# Patient Record
Sex: Female | Born: 1990 | Race: White | Hispanic: No | Marital: Single
Health system: Southern US, Community
[De-identification: ages and names within clinical notes are randomized; demographics above are authoritative.]

---

## 2013-10-13 ENCOUNTER — Other Ambulatory Visit: Payer: Self-pay | Admitting: Allergy and Immunology

## 2013-10-13 ENCOUNTER — Other Ambulatory Visit: Payer: Self-pay | Admitting: *Deleted

## 2013-10-13 ENCOUNTER — Ambulatory Visit
Admission: RE | Admit: 2013-10-13 | Discharge: 2013-10-13 | Disposition: A | Discharge status: Home / Self Care | Payer: BC Managed Care – PPO | Source: Ambulatory Visit | Attending: Pediatrics | Admitting: Pediatrics

## 2013-10-13 DIAGNOSIS — R0789 Other chest pain: Secondary | ICD-10-CM

## 2013-10-13 DIAGNOSIS — J45901 Unspecified asthma with (acute) exacerbation: Secondary | ICD-10-CM

## 2014-11-29 IMAGING — CR DG CHEST 2V
2 series · 2 of 2 positions shown · non-contrast
Comparison: None.

CLINICAL DATA: Asthma.  Chest pain.

EXAM:
CHEST  2 VIEW

[w chest pa]
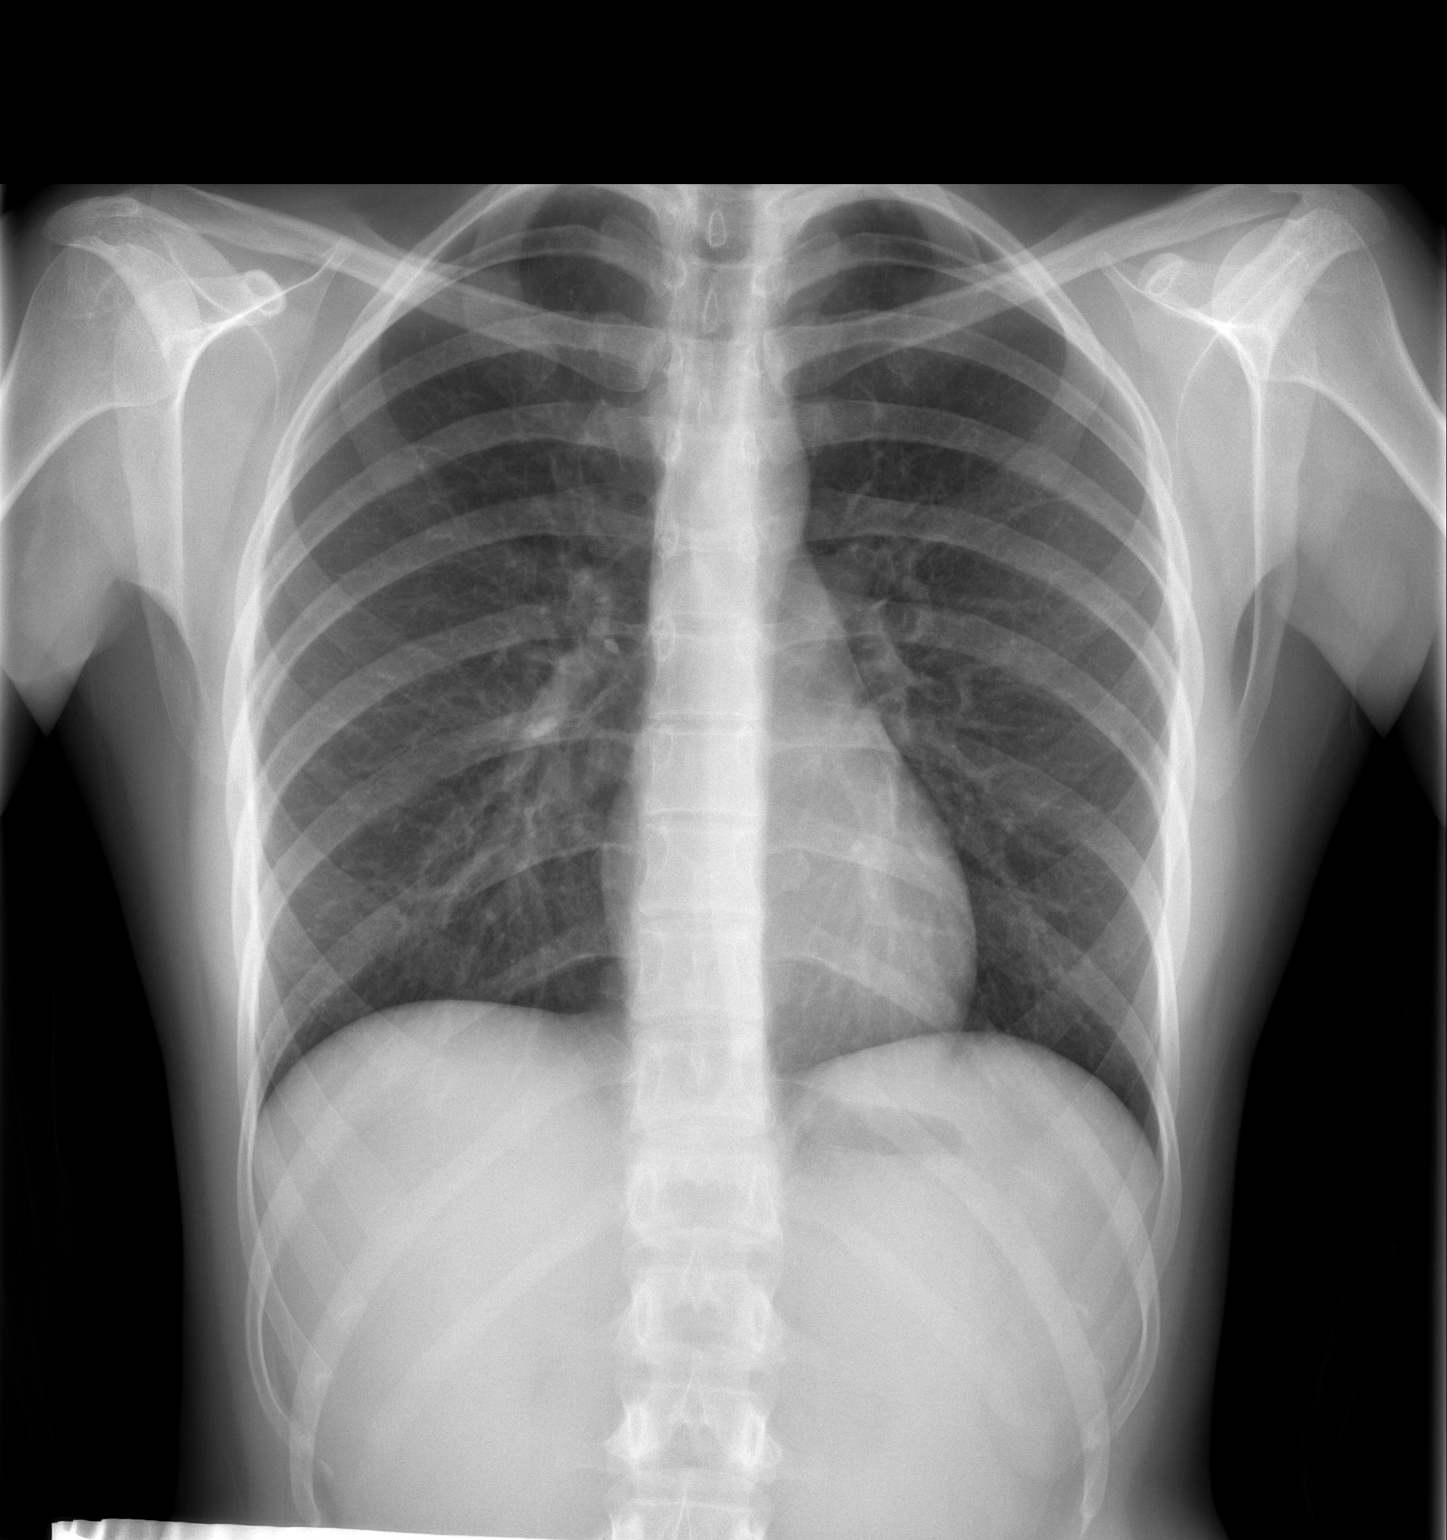

[w chest lat]
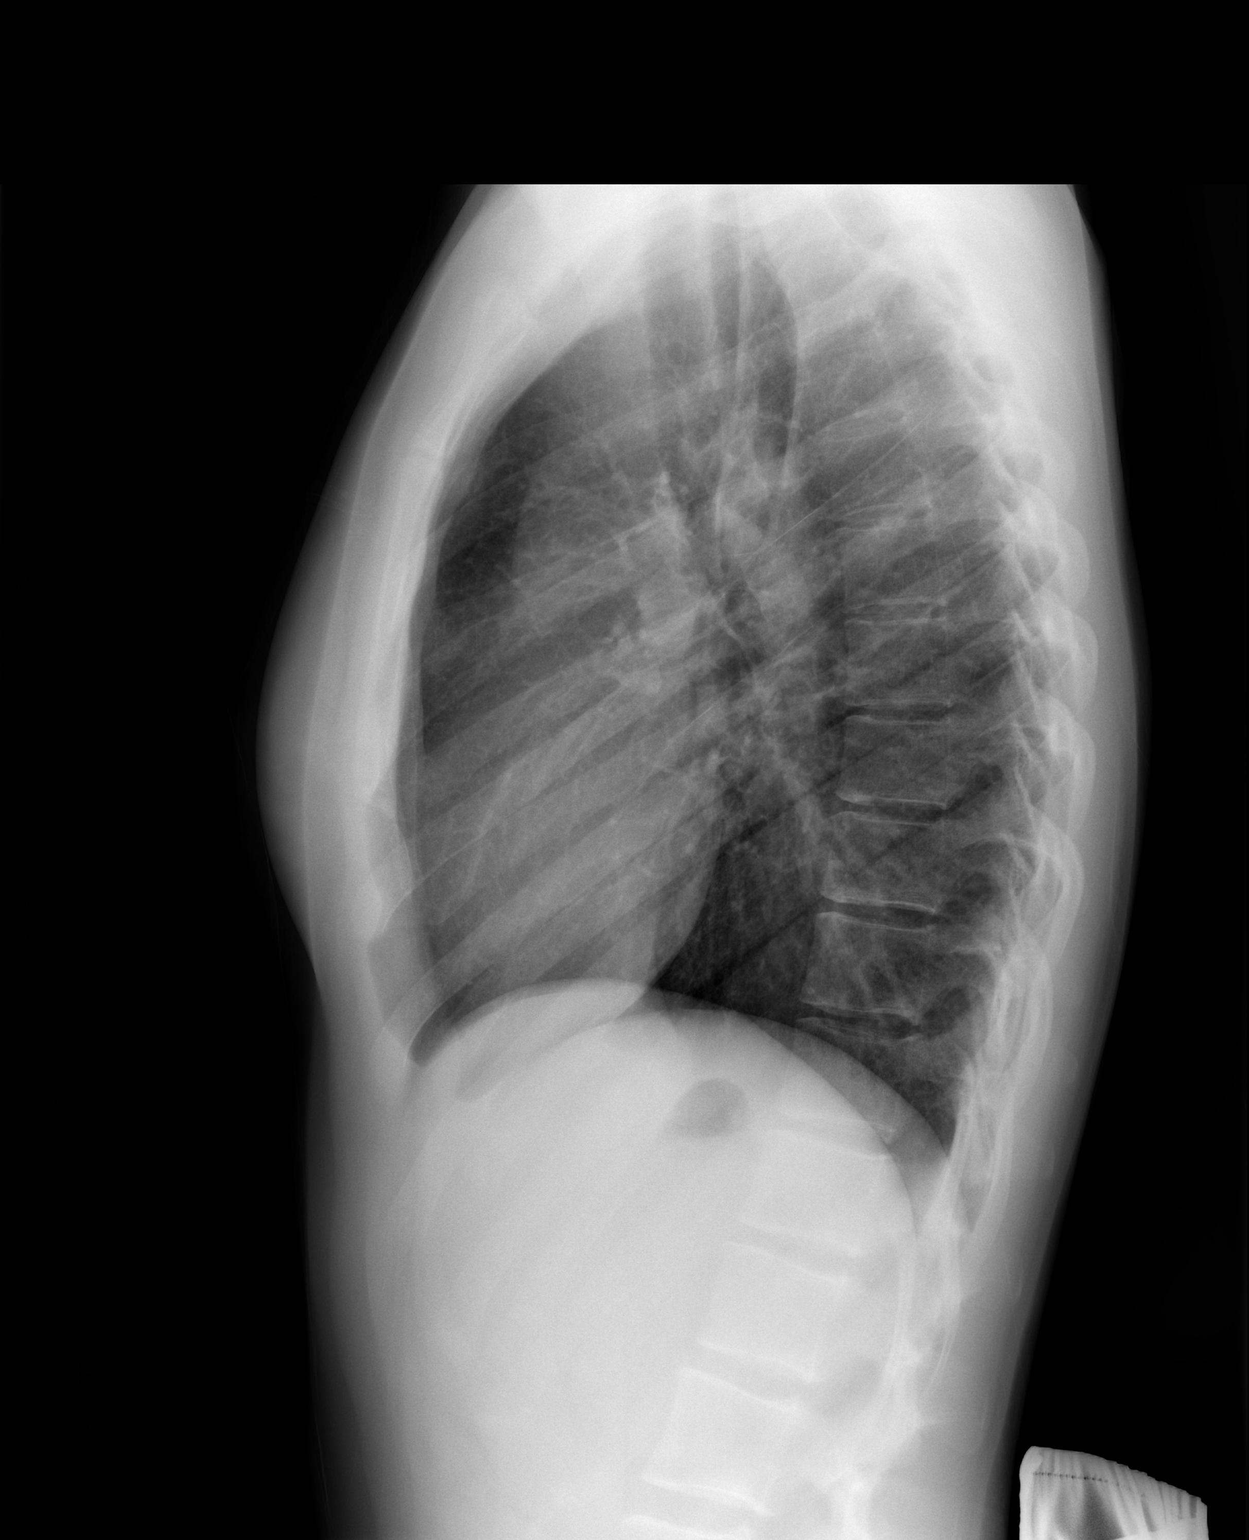

[2 of 2 positions shown; findings below may reference images not displayed]

FINDINGS: The heart size and mediastinal contours are within normal limits.
Both lungs are clear. The visualized skeletal structures are
unremarkable.
IMPRESSION: Normal chest x-ray.

## 2016-03-07 ENCOUNTER — Ambulatory Visit: Payer: Self-pay | Admitting: Allergy and Immunology
